# Patient Record
Sex: Male | Born: 1960 | Race: White | Hispanic: No | Marital: Single | Smoking: Current every day smoker
Health system: Southern US, Community
[De-identification: ages and names within clinical notes are randomized; demographics above are authoritative.]

## PROBLEM LIST (undated history)

## (undated) DIAGNOSIS — M1990 Unspecified osteoarthritis, unspecified site: Secondary | ICD-10-CM

## (undated) DIAGNOSIS — J40 Bronchitis, not specified as acute or chronic: Secondary | ICD-10-CM

## (undated) DIAGNOSIS — J45909 Unspecified asthma, uncomplicated: Secondary | ICD-10-CM

## (undated) HISTORY — PX: APPENDECTOMY: SHX54

---

## 2015-10-04 ENCOUNTER — Emergency Department
Admission: EM | Admit: 2015-10-04 | Discharge: 2015-10-04 | Disposition: A | Discharge status: Home / Self Care | Payer: Self-pay | Attending: Emergency Medicine | Admitting: Emergency Medicine

## 2015-10-04 DIAGNOSIS — S0502XA Injury of conjunctiva and corneal abrasion without foreign body, left eye, initial encounter: Secondary | ICD-10-CM | POA: Insufficient documentation

## 2015-10-04 DIAGNOSIS — Y929 Unspecified place or not applicable: Secondary | ICD-10-CM | POA: Insufficient documentation

## 2015-10-04 DIAGNOSIS — W228XXA Striking against or struck by other objects, initial encounter: Secondary | ICD-10-CM | POA: Insufficient documentation

## 2015-10-04 DIAGNOSIS — F17210 Nicotine dependence, cigarettes, uncomplicated: Secondary | ICD-10-CM | POA: Insufficient documentation

## 2015-10-04 DIAGNOSIS — M1990 Unspecified osteoarthritis, unspecified site: Secondary | ICD-10-CM | POA: Insufficient documentation

## 2015-10-04 DIAGNOSIS — Y9389 Activity, other specified: Secondary | ICD-10-CM | POA: Insufficient documentation

## 2015-10-04 DIAGNOSIS — J45909 Unspecified asthma, uncomplicated: Secondary | ICD-10-CM | POA: Insufficient documentation

## 2015-10-04 DIAGNOSIS — Y999 Unspecified external cause status: Secondary | ICD-10-CM | POA: Insufficient documentation

## 2015-10-04 HISTORY — DX: Unspecified osteoarthritis, unspecified site: M19.90

## 2015-10-04 HISTORY — DX: Unspecified asthma, uncomplicated: J45.909

## 2015-10-04 HISTORY — DX: Bronchitis, not specified as acute or chronic: J40

## 2015-10-04 MED ORDER — EYE WASH OPHTH SOLN
1.0000 [drp] | OPHTHALMIC | Status: DC | PRN
  Filled 2015-10-04: qty 118

## 2015-10-04 MED ORDER — TOBRAMYCIN 0.3 % OP SOLN: 2.0000 [drp] | OPHTHALMIC | Status: AC

## 2015-10-04 MED ORDER — TETRACAINE HCL 0.5 % OP SOLN
1.0000 [drp] | Freq: Once | OPHTHALMIC | Status: AC
  Administered 2015-10-04: 1 [drp] via OPHTHALMIC
  Filled 2015-10-04: qty 2

## 2015-10-04 MED ORDER — FLUORESCEIN SODIUM 1 MG OP STRP
1.0000 | ORAL_STRIP | Freq: Once | OPHTHALMIC | Status: AC
Start: 2015-10-04 — End: 2015-10-04
  Administered 2015-10-04: 1 via OPHTHALMIC
  Filled 2015-10-04: qty 1

## 2015-10-04 NOTE — Discharge Instructions (Signed)
Corneal Abrasion The cornea is the clear covering at the front and center of the eye. When you look at the colored portion of the eye, you are looking through the cornea. It is a thin tissue made up of layers. The top layer is the most sensitive layer. A corneal abrasion happens if this layer is scratched or an injury causes it to come off.  HOME CARE  You may be given drops or a medicated cream. Use the medicine as told by your doctor.  A pressure patch may be put over the eye. If this is done, follow your doctor's instructions for when to remove the patch. Do not drive or use machines while the eye patch is on. Judging distances is hard to do with a patch on.  See your doctor for a follow-up exam if you are told to do so. It is very important that you keep this appointment. GET HELP IF:   You have pain, are sensitive to light, and have a scratchy feeling in one eye or both eyes.  Your pressure patch keeps getting loose. You can blink your eye under the patch.  You have fluid coming from your eye or the lids stick together in the morning.  You have the same symptoms in the morning that you did with the first abrasion. This could be days, weeks, or months after the first abrasion healed.   This information is not intended to replace advice given to you by your health care provider. Make sure you discuss any questions you have with your health care provider.   Document Released: 09/12/2007 Document Revised: 12/15/2014 Document Reviewed: 12/01/2012 Elsevier Interactive Patient Education 2016 [employer]   Follow-up with [facility] if any continued problems tomorrow. Call the number on your papers and let them know that you were in the emergency room and referred to them. Begin using Tobrex ophthalmic solution 2 drops every 4 hours while you are awake. Wear sunglasses  when exposed to bright light. You may  also take Tylenol if needed for pain.

## 2015-10-04 NOTE — ED Notes (Signed)
See triage note  States he got a piece of metal in left eye this am having increased pain to eye

## 2015-10-04 NOTE — ED Notes (Signed)
Pt states he was drilling and got a piece of metal in the left eye .

## 2015-10-04 NOTE — ED Provider Notes (Signed)
[facility] Emergency Department Provider Note   ____________________________________________  Time seen: Approximately 12:14 PM  I have reviewed the triage vital signs and the nursing notes.   HISTORY  Chief Complaint Foreign Body in Eye   HPI Allen Payne is a 55 y.o. male is here complaint of foreign body sensation in his left eye since this morning. Patient states he was drilling without the use of protective eyewear when he felt a piece of metal go in his left eye. Patient tried to get it out but continues to have the sensation that is still there. Patient denies any change in his vision other than it hurts him Bright light. Patient states that on a daily basis his vision is not great and that he has not seen a ophthalmologist to have his vision corrected. Currently his pain is 10 over 10.   Past Medical History  Diagnosis Date  . Asthma   . Bronchitis   . Arthritis     There are no active problems to display for this patient.   History reviewed. No pertinent past surgical history.  Current Outpatient Rx  Name  Route  Sig  Dispense  Refill  . tobramycin (TOBREX) 0.3 % ophthalmic solution   Left Eye   Place 2 drops into the left eye every 4 (four) hours.   5 mL   0     Allergies Review of patient's allergies indicates no known allergies.  No family history on file.  Social History Social History  Substance Use Topics  . Smoking status: Current Every Day Smoker    Types: Cigarettes  . Smokeless tobacco: None  . Alcohol Use: No    Review of Systems Constitutional: No fever/chills Eyes: Positive photophobia. Positive foreign body sensation. ENT: No sore throat. Cardiovascular: Denies chest pain. Respiratory: Denies shortness of breath. Gastrointestinal:  No nausea, no vomiting.   Skin: Negative for rash. Neurological: Negative for headaches  10-point ROS otherwise  negative.  ____________________________________________   PHYSICAL EXAM:  VITAL SIGNS: ED Triage Vitals  Enc Vitals Group     BP 10/04/15 1203 111/72 mmHg     Pulse Rate 10/04/15 1203 71     Resp 10/04/15 1203 16     Temp 10/04/15 1203 97.5 F (36.4 C)     Temp Source 10/04/15 1203 Oral     SpO2 10/04/15 1203 96 %     Weight 10/04/15 1208 155 lb (70.308 kg)     Height 10/04/15 1208 5\' 8"  (1.727 m)     Head Cir --      Peak Flow --      Pain Score 10/04/15 1159 10     Pain Loc --      Pain Edu? --      Excl. in GC? --     Constitutional: Alert and oriented. Well appearing and in no acute distress. Eyes: Left conjunctiva is moderately injected. There is no obvious foreign body noted. PERRL. EOMI. right conjunctiva is clear. Head: Atraumatic. Nose: No congestion/rhinnorhea. Neck: No stridor.   Cardiovascular: Normal rate, regular rhythm. Grossly normal heart sounds.  Good peripheral circulation. Respiratory: Normal respiratory effort.  No retractions. Lungs CTAB. Gastrointestinal: Soft and nontender. No distention. Musculoskeletal: Moves upper and lower extremities without any difficulty and normal gait was noted. Neurologic:  Normal speech and language. No gross focal neurologic deficits are appreciated. No gait instability. Skin:  Skin is warm, dry and intact. No rash noted. Psychiatric: Mood and affect are  normal. Speech and behavior are normal.  ____________________________________________   LABS (all labs ordered are listed, but only abnormal results are displayed)  Labs Reviewed - No data to display  PROCEDURES  Procedure(s) performed: Tetracaine was placed in the left eye and when patient was able tolerate eye was explored. No obvious foreign body was noted. Fluorescein ophthalmic strip was used and 2 very super small corneal abrasions were noted at approximately 9:00 in a linear pattern. There was inverted with a Q-tip without foreign body noted. Eye was  moderately irrigated with ophthalmic solution.  Critical Care performed: No  ____________________________________________   INITIAL IMPRESSION / ASSESSMENT AND PLAN / ED COURSE  Pertinent labs & imaging results that were available during my care of the patient were reviewed by me and considered in my medical decision making (see chart for details).  Patient was given a prescription for Tobrex ophthalmic solution to use 2 drops every 4 hours while awake. He is informed to follow-up with [facility] if any continued problems with his eye tomorrow. He is also advised to use sunglasses to protect his eye from bright light. Patient is take Tylenol if needed for pain. ____________________________________________   FINAL CLINICAL IMPRESSION(S) / ED DIAGNOSES  Final diagnoses:  Corneal abrasion, left, initial encounter      NEW MEDICATIONS STARTED DURING THIS VISIT:  Discharge Medication List as of 10/04/2015  1:22 PM    START taking these medications   Details  tobramycin (TOBREX) 0.3 % ophthalmic solution Place 2 drops into the left eye every 4 (four) hours., Starting 10/04/2015, Until Discontinued, Print         Note:  This document was prepared using Dragon voice recognition software and may include unintentional dictation errors.    Tommi RumpsRhonda L Beyonce Sawatzky, PA-C 10/04/15 1408  Jene Everyobert Kinner, MD 10/04/15 [phone]

## 2015-10-20 ENCOUNTER — Emergency Department: Payer: Self-pay

## 2015-10-20 ENCOUNTER — Emergency Department
Admission: EM | Admit: 2015-10-20 | Discharge: 2015-10-20 | Disposition: A | Discharge status: Home / Self Care | Payer: Self-pay | Attending: Emergency Medicine | Admitting: Emergency Medicine

## 2015-10-20 ENCOUNTER — Encounter: Payer: Self-pay | Admitting: Emergency Medicine

## 2015-10-20 DIAGNOSIS — F17210 Nicotine dependence, cigarettes, uncomplicated: Secondary | ICD-10-CM | POA: Insufficient documentation

## 2015-10-20 DIAGNOSIS — T672XXA Heat cramp, initial encounter: Secondary | ICD-10-CM | POA: Insufficient documentation

## 2015-10-20 DIAGNOSIS — F1290 Cannabis use, unspecified, uncomplicated: Secondary | ICD-10-CM | POA: Insufficient documentation

## 2015-10-20 DIAGNOSIS — M1990 Unspecified osteoarthritis, unspecified site: Secondary | ICD-10-CM | POA: Insufficient documentation

## 2015-10-20 DIAGNOSIS — J45909 Unspecified asthma, uncomplicated: Secondary | ICD-10-CM | POA: Insufficient documentation

## 2015-10-20 LAB — COMPREHENSIVE METABOLIC PANEL
ALBUMIN: 4.7 g/dL (ref 3.5–5.0)
ALT: 17 U/L (ref 17–63)
ANION GAP: 11 (ref 5–15)
AST: 29 U/L (ref 15–41)
Alkaline Phosphatase: 72 U/L (ref 38–126)
BUN: 28 mg/dL — AB (ref 6–20)
CHLORIDE: 102 mmol/L (ref 101–111)
CO2: 25 mmol/L (ref 22–32)
Calcium: 10 mg/dL (ref 8.9–10.3)
Creatinine, Ser: 1.16 mg/dL (ref 0.61–1.24)
GFR calc Af Amer: >60 mL/min (ref >60)
GFR calc non Af Amer: >60 mL/min (ref >60)
GLUCOSE: 126 mg/dL — AB (ref 65–99)
POTASSIUM: 4.2 mmol/L (ref 3.5–5.1)
SODIUM: 138 mmol/L (ref 135–145)
TOTAL PROTEIN: 7.7 g/dL (ref 6.5–8.1)
Total Bilirubin: 1.4 mg/dL — ABNORMAL HIGH (ref 0.3–1.2)

## 2015-10-20 LAB — CBC
HEMATOCRIT: 48.8 % (ref 40.0–52.0)
HEMOGLOBIN: 16.7 g/dL (ref 13.0–18.0)
MCH: 31.3 pg (ref 26.0–34.0)
MCHC: 34.3 g/dL (ref 32.0–36.0)
MCV: 91.2 fL (ref 80.0–100.0)
Platelets: 246 10*3/uL (ref 150–440)
RBC: 5.35 MIL/uL (ref 4.40–5.90)
RDW: 14.2 % (ref 11.5–14.5)
WBC: 8.5 10*3/uL (ref 3.8–10.6)

## 2015-10-20 LAB — LIPASE, BLOOD: LIPASE: 29 U/L (ref 11–51)

## 2015-10-20 MED ORDER — IPRATROPIUM-ALBUTEROL 0.5-2.5 (3) MG/3ML IN SOLN
3.0000 mL | Freq: Once | RESPIRATORY_TRACT | Status: AC
  Administered 2015-10-20: 3 mL via RESPIRATORY_TRACT
  Filled 2015-10-20 (×2): qty 3

## 2015-10-20 MED ORDER — KETOROLAC TROMETHAMINE 30 MG/ML IJ SOLN
15.0000 mg | Freq: Once | INTRAMUSCULAR | Status: AC
  Administered 2015-10-20: 15 mg via INTRAVENOUS
  Filled 2015-10-20: qty 1

## 2015-10-20 MED ORDER — ONDANSETRON HCL 4 MG/2ML IJ SOLN
4.0000 mg | Freq: Once | INTRAMUSCULAR | Status: AC
  Administered 2015-10-20: 4 mg via INTRAVENOUS
  Filled 2015-10-20: qty 2

## 2015-10-20 MED ORDER — SODIUM CHLORIDE 0.9 % IV BOLUS (SEPSIS)
1000.0000 mL | Freq: Once | INTRAVENOUS | Status: AC
  Administered 2015-10-20: 1000 mL via INTRAVENOUS

## 2015-10-20 NOTE — ED Notes (Signed)
Pt complains of cramping to entire body for two days. Pt complains of vomiting but denies diarrhea.

## 2015-10-20 NOTE — ED Provider Notes (Signed)
Time Seen: Approximately *1430  I have reviewed the triage notes  Chief Complaint: Generalized Body Aches   History of Present Illness: Allen Payne is a 55 y.o. male *who presents with diffuse muscle cramps over the last 2 days. Patient feels it may be related. He's had some nausea and states he's had trouble keeping down any fluids. Cramps are diffuse mainly across the large muscle groups. States he's had similar issues before with the heat. Patient denies any loose stool or diarrhea and denies any chest pain though he does have a history of asthma.   Past Medical History  Diagnosis Date  . Asthma   . Bronchitis   . Arthritis     There are no active problems to display for this patient.   Past Surgical History  Procedure Laterality Date  . Appendectomy      Past Surgical History  Procedure Laterality Date  . Appendectomy      Current Outpatient Rx  Name  Route  Sig  Dispense  Refill  . tobramycin (TOBREX) 0.3 % ophthalmic solution   Left Eye   Place 2 drops into the left eye every 4 (four) hours.   5 mL   0     Allergies:  Review of patient's allergies indicates no known allergies.  Family History: No family history on file.  Social History: Social History  Substance Use Topics  . Smoking status: Current Every Day Smoker -- 1.00 packs/day    Types: Cigarettes  . Smokeless tobacco: None  . Alcohol Use: Yes     Comment: occ     Review of Systems:   10 point review of systems was performed and was otherwise negative:  Constitutional: No fever Eyes: No visual disturbances ENT: No sore throat, ear pain Cardiac: No chest pain Respiratory: No shortness of breath, wheezing, or stridor Abdomen: No abdominal pain, No hematemesis, No diarrhea Endocrine: No weight loss, No night sweats Extremities: No peripheral edema, cyanosis Skin: No rashes, easy bruising Neurologic: No focal weakness, trouble with speech or swollowing Urologic: No dysuria,  Hematuria, or urinary frequency   Physical Exam:  ED Triage Vitals  Enc Vitals Group     BP 10/20/15 1331 116/72 mmHg     Pulse Rate 10/20/15 1331 98     Resp 10/20/15 1331 18     Temp 10/20/15 1331 97.8 F (36.6 C)     Temp Source 10/20/15 1331 Oral     SpO2 10/20/15 1331 97 %     Weight 10/20/15 1331 135 lb (61.236 kg)     Height 10/20/15 1331  (1.651 m)     Head Cir --      Peak Flow --      Pain Score 10/20/15 1331 9     Pain Loc --      Pain Edu? --      Excl. in GC? --     General: Awake , Alert , and Oriented times 3; GCS 15 Head: Normal cephalic , atraumatic Eyes: Pupils equal , round, reactive to light Nose/Throat: No nasal drainage, patent upper airway without erythema or exudate.  Neck: Supple, Full range of motion, No anterior adenopathy or palpable thyroid masses Lungs: Wheezing auscultated diffusely Heart: Regular rate, regular rhythm without murmurs , gallops , or rubs Abdomen: Soft, non tender without rebound, guarding , or rigidity; bowel sounds positive and symmetric in all 4 quadrants. No organomegaly .        Extremities: 2 plus  symmetric pulses. No edema, clubbing or cyanosis Neurologic: normal ambulation, Motor symmetric without deficits, sensory intact Skin: warm, dry, no rashes   Labs:   All laboratory work was reviewed including any pertinent negatives or positives listed below:  Labs Reviewed  COMPREHENSIVE METABOLIC PANEL - Abnormal; Notable for the following:    Glucose, Bld 126 (*)    BUN 28 (*)    Total Bilirubin 1.4 (*)    All other components within normal limits  LIPASE, BLOOD  CBC  URINALYSIS COMPLETEWITH MICROSCOPIC ([facility] ONLY)  Laboratory work was reviewed and showed no clinically significant abnormalities.    Radiology: *    DG Chest 2 View (Final result) Result time: 10/20/15 14:51:56   Final result by Rad Results In Interface (10/20/15 14:51:56)   Narrative:   CLINICAL DATA: Wheezing and chest  pain  EXAM: CHEST 2 VIEW  COMPARISON: None.  FINDINGS: The heart size and mediastinal contours are within normal limits. Both lungs are clear. The visualized skeletal structures are unremarkable.  IMPRESSION: No active cardiopulmonary disease.   Electronically Signed By: Alcide CleverMark Lukens M.D.       I personally reviewed the radiologic studies     ED Course:  Patient's stay here showed symptomatic improvement patient was given IV fluid bolus. He was also given a duo nebs secondary to his wheezing. Patient's cramping has resolved at this point he does probably have some heat related cramps. His electrolytes all appear to be within normal limits.    Assessment:  Heat cramps Acute exacerbation of chronic asthma       Plan: * Outpatient management Patient was advised to return immediately if condition worsens. Patient was advised to follow up with their primary care physician or other specialized physicians involved in their outpatient care. The patient and/or family member/power of attorney had laboratory results reviewed at the bedside. All questions and concerns were addressed and appropriate discharge instructions were distributed by the nursing staff.            Jennye MoccasinBrian S Hulda Reddix, MD 10/20/15 [phone]

## 2015-10-20 NOTE — Discharge Instructions (Signed)
Heat Exhaustion Information WHAT IS HEAT EXHAUSTION? Heat exhaustion happens when your body gets overheated from hot weather or from exercise. Heat exhaustion makes the temperature of your skin and body go up. Your body cools itself by sweating. If you do not drink enough water to replace what you sweat, you lose too much water and salt. This makes it harder for your body to produce more sweat. When you do not sweat enough, your body cannot cool down, and heat exhaustion may result. Heat exhaustion can lead to heatstroke, which is a more serious illness. WHO IS AT RISK FOR HEAT EXHAUSTION? Anyone can get heat exhaustion. However, heat exhaustion is more likely when you are exercising or doing a physical activity. It is also more likely when you are in hot and humid weather or bright sunshine. Heat exhaustion is also more likely to develop in:  People who are age 65 or older.  Children.  People who have a medical condition such as heart disease, poor circulation, sickle cell disease, or high blood pressure.  People who have a fever.  People who are very overweight (obese).  People who are dehydrated from:  Drinking alcohol or caffeine.  Taking certain medicines, such as diuretics or stimulants. WHAT ARE THE SYMPTOMS OF HEAT EXHAUSTION? Symptoms of heat exhaustion include:  A body temperature of up to 104F (40C).  Moist, cool, and clammy skin.  Dizziness.  Headache.  Nausea.  Fatigue.  Thirst.  Dark-colored urine.  Rapid pulse or heartbeat.  Weakness.  Muscle cramps.  Confusion.  Fainting. WHAT SHOULD I DO IF I THINK I HAVE HEAT EXHAUSTION? If you think that you have heat exhaustion, call your health care provider. Follow his or her instructions. You should also:  Call a friend or a family member and ask someone to stay with you.  Move to a cooler location, such as:  Into the shade.  In front of a fan.  Someplace that has air conditioning.  Lie down  and rest.  Slowly drink nonalcoholic, caffeine-free fluids.  Take off any extra clothing or tight-fitting clothes.  Take a cool bath or shower, if possible. If you do not have access to a bath or shower, dab or mist cool water on your skin. WHY IS IT IMPORTANT TO TREAT HEAT EXHAUSTION? It is extremely important to take care of yourself and treat heat exhaustion as soon as possible. Untreated heat exhaustion can turn into heatstroke. Symptoms of heatstroke include:  A body temperature of 104F (40C) or higher.  Hot, red skin that may be dry or moist.  Severe headache.  Nausea and vomiting.  Muscle weakness and cramping.  Confusion.  Rapid breathing.  Fainting.  Seizure. These symptoms may represent a serious problem that is an emergency. Do not wait to see if the symptoms will go away. Get medical help right away. Call your local emergency services (911 in the [place] Do not drive yourself to the hospital. Heatstroke is a life-threatening condition that requires urgent medical treatment. Do not treat heatstroke at home. Heatstroke should be treated by a health care professional and may require hospitalization. At the hospital, you may need to receive fluids through an IV tube:  If you cannot drink any fluids.  If you vomit any fluids that you drink.  If your symptoms do not get better after one hour.  If your symptoms get worse after one hour. HOW CAN I PREVENT HEAT EXHAUSTION?  Avoid outdoor activities on very hot or humid days.    Do not exercise or do other physical activity when you are not feeling well.  Drink plenty of nonalcoholic and caffeine-free fluids before and during physical activity.  Take frequent breaks for rest during physical activity.  Wear light-colored, loose-fitting, and lightweight clothing in the heat.  Wear a hat and use sunscreen when exercising outdoors.  Avoid being outside during the hottest times of the day.  Check with your health  care provider before you start any new activity, especially if you take medicine or have a medical condition.  Start any new activity slowly and work up to your fitness level. HOW CAN I HELP TO PROTECT ELDERLY RELATIVES AND NEIGHBORS FROM HEAT EXHAUSTION? People who are age 65 or older are at greater risk for heat exhaustion. Their bodies have a harder time adjusting to heat. They are also more likely to have a medical condition or be on medicines that increase their risk for heat exhaustion. They may get heat exhaustion indoors if the heat is high for several days. You can help to protect them during hot weather by:  Checking on them two or more times each day.  Making sure that they are drinking plenty of cool, nonalcoholic, and caffeine-free fluids.  Making sure that they use their air conditioner.  Taking them to a location where air conditioning is available.  Talking with their health care provider about their medical needs, medicines, and fluid requirements.   This information is not intended to replace advice given to you by your health care provider. Make sure you discuss any questions you have with your health care provider.   Document Released: 01/03/2008 Document Revised: 12/15/2014 Document Reviewed: 03/03/2014 Elsevier Interactive Patient Education 2016 [employer]  

## 2016-11-02 IMAGING — CR DG CHEST 2V
1 series · 2 of 2 positions shown · non-contrast
Comparison: None.

CLINICAL DATA: Wheezing and chest pain

EXAM:
CHEST  2 VIEW

[Series 1: dg chest 2 view · 0.14mm/px · 2 of 2 slices shown]
[im 1/2]
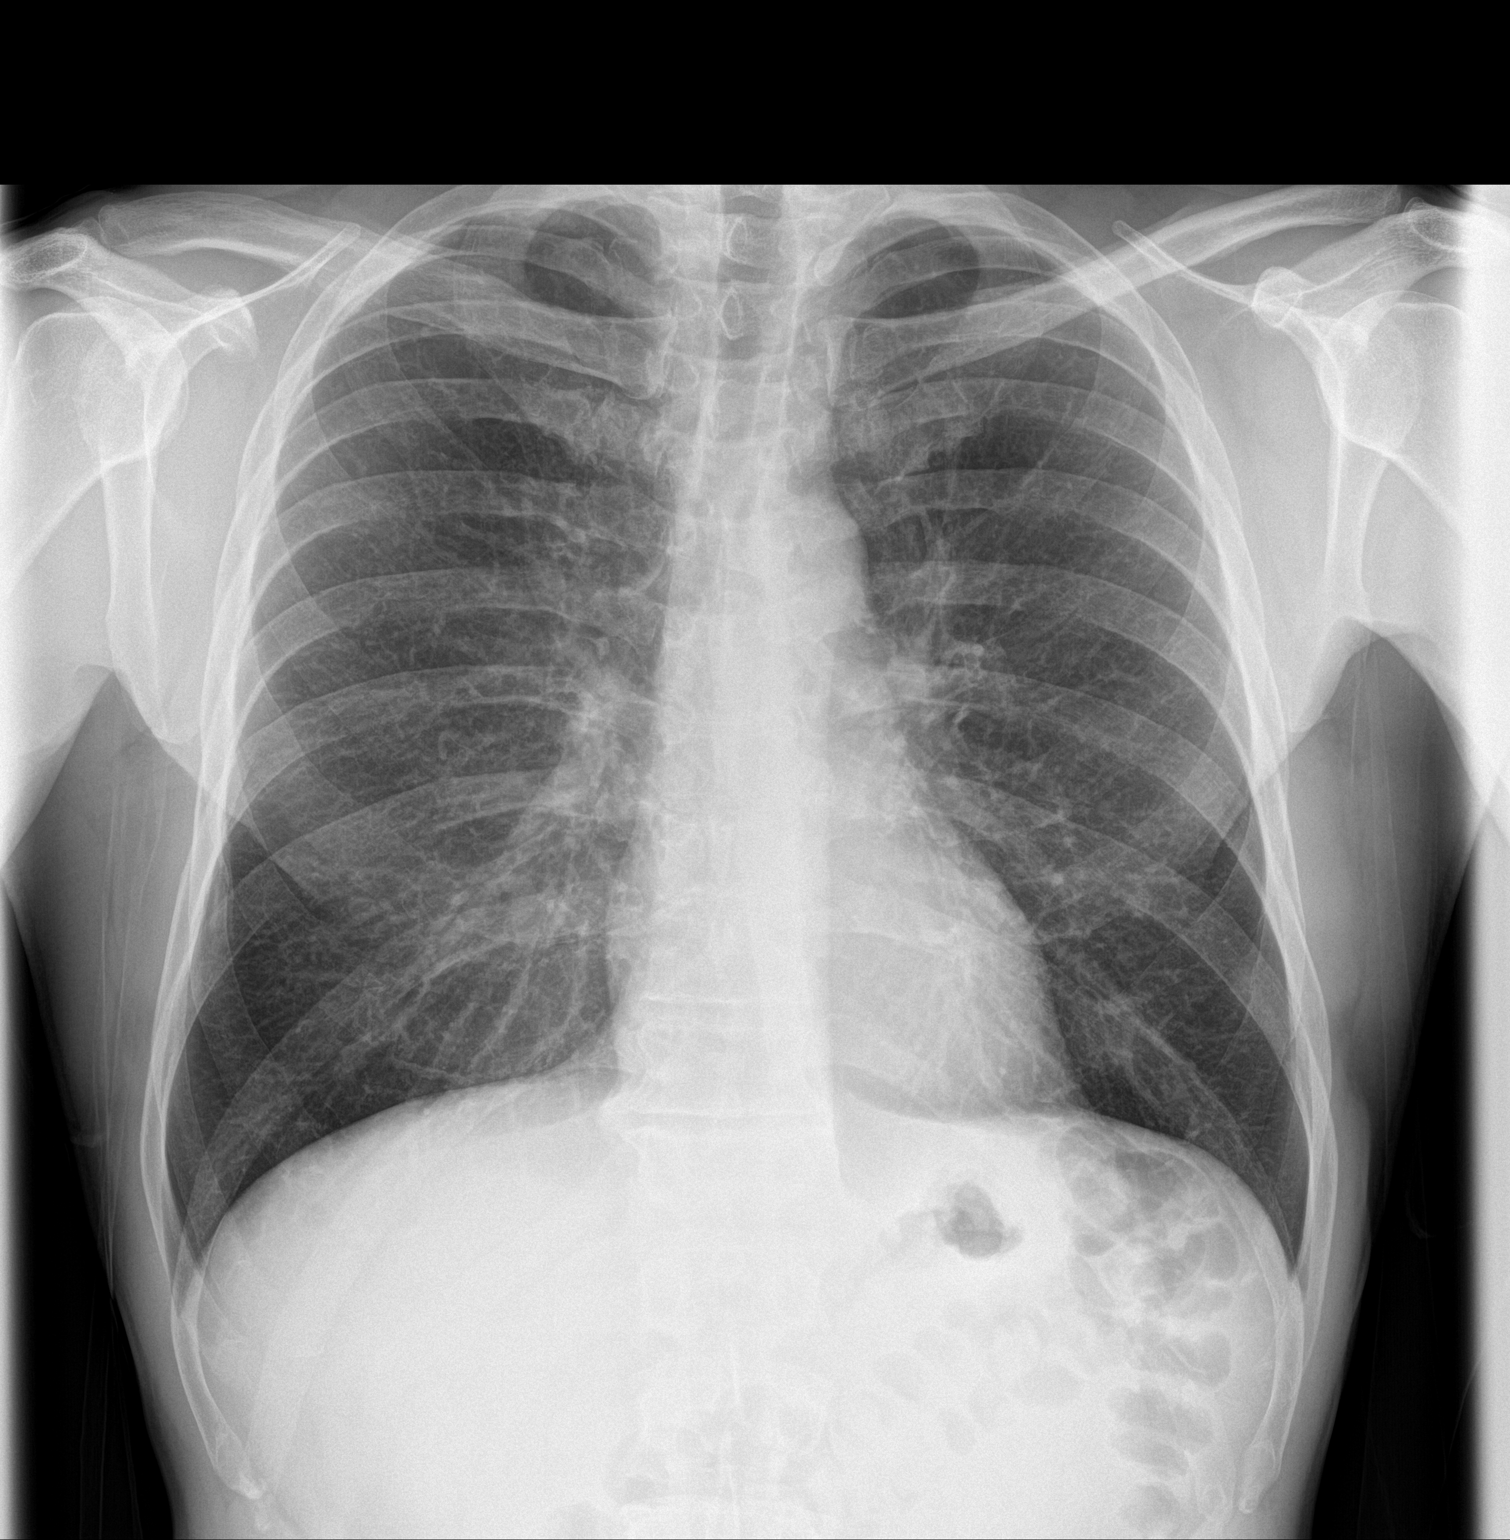
[im 2/2]
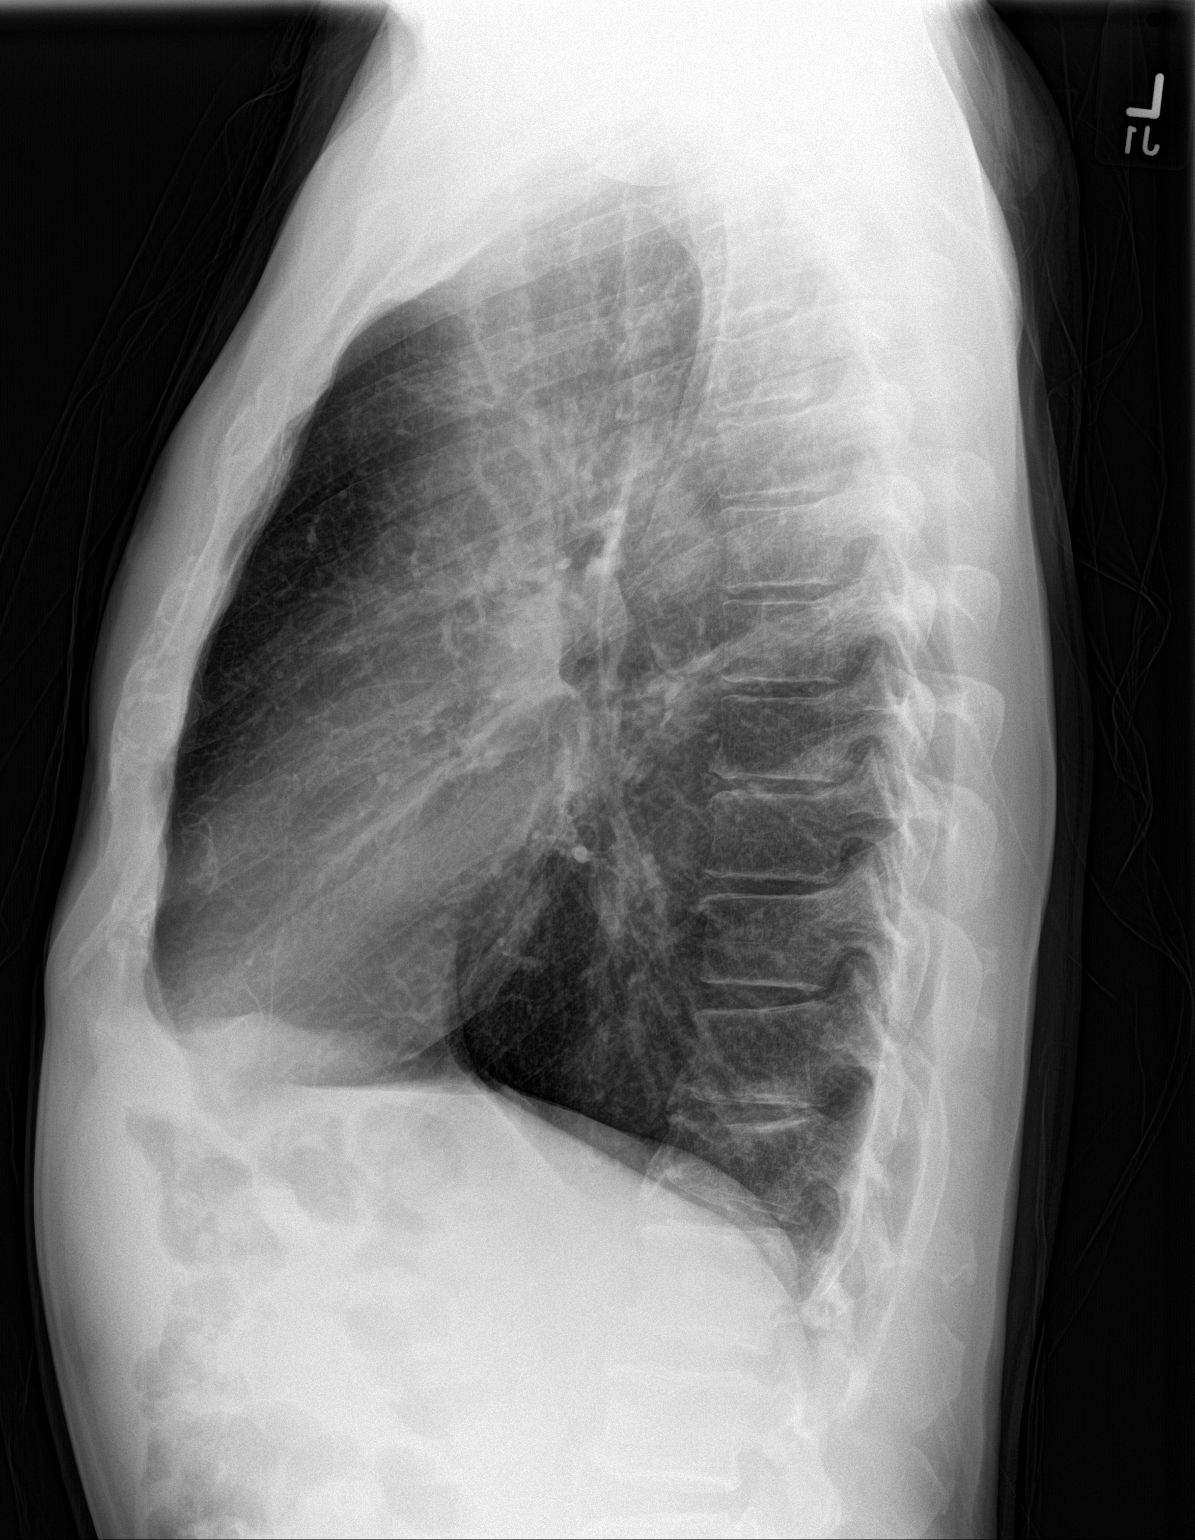

[2 of 2 positions shown; findings below may reference images not displayed]

FINDINGS: The heart size and mediastinal contours are within normal limits.
Both lungs are clear. The visualized skeletal structures are
unremarkable.
IMPRESSION: No active cardiopulmonary disease.
